# Patient Record
Sex: Male | Born: 1985 | Race: Black or African American | Hispanic: No | Marital: Single | Smoking: Former smoker
Health system: Southern US, Community
[De-identification: ages and names within clinical notes are randomized; demographics above are authoritative.]

---

## 2006-07-02 ENCOUNTER — Emergency Department (HOSPITAL_COMMUNITY): Admission: EM | Admit: 2006-07-02 | Discharge: 2006-07-02 | Payer: Self-pay | Admitting: Emergency Medicine
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
## 2015-11-14 ENCOUNTER — Emergency Department (HOSPITAL_COMMUNITY)
Admission: EM | Admit: 2015-11-14 | Discharge: 2015-11-14 | Disposition: A | Discharge status: Home / Self Care | Payer: PRIVATE HEALTH INSURANCE | Attending: Emergency Medicine | Admitting: Emergency Medicine

## 2015-11-14 DIAGNOSIS — R112 Nausea with vomiting, unspecified: Secondary | ICD-10-CM

## 2015-11-14 DIAGNOSIS — Z7982 Long term (current) use of aspirin: Secondary | ICD-10-CM | POA: Diagnosis not present

## 2015-11-14 DIAGNOSIS — R002 Palpitations: Secondary | ICD-10-CM | POA: Diagnosis present

## 2015-11-14 DIAGNOSIS — I4891 Unspecified atrial fibrillation: Secondary | ICD-10-CM

## 2015-11-14 LAB — CBC
HEMATOCRIT: 48.2 % (ref 39.0–52.0)
Hemoglobin: 17.1 g/dL — ABNORMAL HIGH (ref 13.0–17.0)
MCH: 29.3 pg (ref 26.0–34.0)
MCHC: 35.5 g/dL (ref 30.0–36.0)
MCV: 82.5 fL (ref 78.0–100.0)
PLATELETS: 188 10*3/uL (ref 150–400)
RBC: 5.84 MIL/uL — ABNORMAL HIGH (ref 4.22–5.81)
RDW: 12.5 % (ref 11.5–15.5)
WBC: 6.2 10*3/uL (ref 4.0–10.5)

## 2015-11-14 LAB — I-STAT CHEM 8, ED
BUN: 19 mg/dL (ref 6–20)
CALCIUM ION: 1.06 mmol/L — AB (ref 1.15–1.40)
CHLORIDE: 103 mmol/L (ref 101–111)
CREATININE: 0.9 mg/dL (ref 0.61–1.24)
GLUCOSE: 88 mg/dL (ref 65–99)
HCT: 51 % (ref 39.0–52.0)
Hemoglobin: 17.3 g/dL — ABNORMAL HIGH (ref 13.0–17.0)
POTASSIUM: 3.7 mmol/L (ref 3.5–5.1)
Sodium: 139 mmol/L (ref 135–145)
TCO2: 23 mmol/L (ref 0–100)

## 2015-11-14 LAB — MAGNESIUM: Magnesium: 1.9 mg/dL (ref 1.7–2.4)

## 2015-11-14 LAB — BASIC METABOLIC PANEL
Anion gap: 12 (ref 5–15)
BUN: 17 mg/dL (ref 6–20)
CALCIUM: 9.6 mg/dL (ref 8.9–10.3)
CO2: 23 mmol/L (ref 22–32)
CREATININE: 1 mg/dL (ref 0.61–1.24)
Chloride: 103 mmol/L (ref 101–111)
GFR calc Af Amer: >60 mL/min (ref >60)
GLUCOSE: 93 mg/dL (ref 65–99)
Potassium: 3.7 mmol/L (ref 3.5–5.1)
Sodium: 138 mmol/L (ref 135–145)

## 2015-11-14 MED ORDER — ONDANSETRON HCL 4 MG PO TABS: 4.0000 mg | ORAL_TABLET | Freq: Three times a day (TID) | ORAL | 0 refills | Status: AC | PRN

## 2015-11-14 MED ORDER — SODIUM CHLORIDE 0.9 % IV BOLUS (SEPSIS)
1000.0000 mL | Freq: Once | INTRAVENOUS | Status: AC
  Administered 2015-11-14: 1000 mL via INTRAVENOUS

## 2015-11-14 MED ORDER — ETOMIDATE 2 MG/ML IV SOLN
10.0000 mg | Freq: Once | INTRAVENOUS | Status: DC
  Filled 2015-11-14: qty 10

## 2015-11-14 MED ORDER — ASPIRIN EC 81 MG PO TBEC: 81.0000 mg | DELAYED_RELEASE_TABLET | Freq: Every day | ORAL | 0 refills | Status: AC

## 2015-11-14 NOTE — ED Triage Notes (Signed)
Patient was experiencing vomiting throughout the day today. Tonight after one episode of vomiting he began to experience a fluttering sensation in his chest. [facility] EMS was called from there because they noted patient to be in irregular heart rhythm. EMS found patient in Afib @ 170bpm. 20 Cardizem was given and patient's rate slowed significantly to 90s-100s. Zofran 4mg  was given and 324mg  ASA was given. Patient currently sitting up speaking with no complaints, GCS 15.

## 2015-11-14 NOTE — ED Triage Notes (Signed)
No history of Afib.

## 2015-11-14 NOTE — ED Provider Notes (Addendum)
[facility]-EMERGENCY DEPT Provider Note   CSN: [id number] Arrival date & time: 11/14/15  1914     History   Chief Complaint  Chief Complaint  Patient presents with  . Emesis  . Atrial Fibrillation    HPI Thomas Velez is a 30 y.o. male.  HPI 30 year old male with no significant past medical history presents with acute onset of palpitations. Patient states that he has had mild nausea throughout the day and then began vomiting. He has known sick contacts with similar symptoms. After his most recent episode of emesis he experienced acute onset of palpitations and shortness of breath. He felt as though his heart was beating irregularly. He went to urgent care and was noted to have a heart rate in the 170s with atrial fibrillation. He was subsequent brought to the ED. In route, EMS gave 20 mg of diltiazem IV with improvement of his heart rate to the 90s to 100s. He has remained in A. fib. Denies any personal or family history of arrhythmia. He is otherwise healthy. Denies any recent changes in medications. No heavy caffeine or alcohol use.  No past medical history on file.  There are no active problems to display for this patient.   No past surgical history on file.     Home Medications    Prior to Admission medications   Medication Sig Start Date End Date Taking? Authorizing Provider  aspirin EC 81 MG tablet Take 1 tablet (81 mg total) by mouth daily. 11/14/15   Shaune Pollackameron Kenston Longton, MD  ondansetron (ZOFRAN) 4 MG tablet Take 1 tablet (4 mg total) by mouth every 8 (eight) hours as needed for nausea or vomiting. 11/14/15   Shaune Pollackameron Ellina Sivertsen, MD    Family History No family history on file.  Social History Social History  Substance Use Topics  . Smoking status: Not on file  . Smokeless tobacco: Not on file  . Alcohol use Not on file     Allergies   Review of patient's allergies indicates not on file.   Review of Systems Review of Systems  Constitutional: Negative for chills,  fatigue and fever.  HENT: Negative for congestion and rhinorrhea.   Eyes: Negative for visual disturbance.  Respiratory: Positive for shortness of breath. Negative for cough and wheezing.   Cardiovascular: Positive for palpitations. Negative for chest pain and leg swelling.  Gastrointestinal: Negative for abdominal pain, diarrhea, nausea and vomiting.  Genitourinary: Negative for dysuria and flank pain.  Musculoskeletal: Negative for neck pain and neck stiffness.  Skin: Negative for rash and wound.  Allergic/Immunologic: Negative for immunocompromised state.  Neurological: Negative for syncope, weakness and headaches.  All other systems reviewed and are negative.    Physical Exam Updated Vital Signs BP 135/95 (BP Location: Right Arm)   Pulse 99   Temp 97.7 F (36.5 C) (Oral)   Resp 26   SpO2 100%   Physical Exam  Constitutional: He is oriented to person, place, and time. He appears well-developed and well-nourished. No distress.  HENT:  Head: Normocephalic and atraumatic.  Eyes: Conjunctivae are normal.  Neck: Neck supple.  Cardiovascular: Normal heart sounds.  An irregularly irregular rhythm present. Tachycardia present.  Exam reveals no friction rub.   No murmur heard. Pulmonary/Chest: Effort normal and breath sounds normal. No respiratory distress. He has no wheezes. He has no rales.  Abdominal: He exhibits no distension.  Musculoskeletal: He exhibits no edema.  Neurological: He is alert and oriented to person, place, and time. He exhibits normal muscle  tone.  Skin: Skin is warm. Capillary refill takes less than 2 seconds.  Psychiatric: He has a normal mood and affect.  Nursing note and vitals reviewed.    ED Treatments / Results  Labs (all labs ordered are listed, but only abnormal results are displayed) Labs Reviewed  CBC - Abnormal; Notable for the following:       Result Value   RBC 5.84 (*)    Hemoglobin 17.1 (*)    All other components within normal limits    I-STAT CHEM 8, ED - Abnormal; Notable for the following:    Calcium, Ion 1.06 (*)    Hemoglobin 17.3 (*)    All other components within normal limits  BASIC METABOLIC PANEL  MAGNESIUM    EKG  EKG Interpretation  Date/Time:  Tuesday November 14 2015 19:36:42 EDT Ventricular Rate:  81 PR Interval:    QRS Duration: 85 QT Interval:  353 QTC Calculation: 410 R Axis:   23 Text Interpretation:  Atrial fibrillation No old tracing to compare Confirmed by Jalysa Swopes MD, Monserrate Blaschke [phone]) on 11/14/2015 7:43:12 PM       Radiology No results found.  Procedures Procedures (including critical care time)  Medications Ordered in ED Medications  etomidate (AMIDATE) injection 10 mg (10 mg Intravenous Not Given 11/14/15 2155)  sodium chloride 0.9 % bolus 1,000 mL (0 mLs Intravenous Stopped 11/14/15 2150)     Initial Impression / Assessment and Plan / ED Course  I have reviewed the triage vital signs and the nursing notes.  Pertinent labs & imaging results that were available during my care of the patient were reviewed by me and considered in my medical decision making (see chart for details).  Clinical Course    30 yo male with no significant PMHx who presents with acute onset of afib with RVR in setting of vomiting. On arrival, patient tachycardic but otherwise hemodynamically stable. I suspect he has acute onset A. fib in the setting of likely viral GI illness. His abdomen is completely soft, nontender, nondistended with no focal tenderness to suggest cholecystitis, appendicitis, significant colitis, or other intra-abdominal pathology. Screening labwork is largely unremarkable with normal white blood cell count. Normal BMP. After IV fluids, patient has spontaneously converted to normal sinus rhythm. He was ambulatory throughout the ED without any recurrence of his palpitations or shortness of breath. He is tolerating by mouth and his nausea is improving. Do not feel further emergent imaging is  indicated at this time. I discussed case with Dr. Mayford Knife of cardiology. Will refer to A. fib clinic but will hold on starting beta blocker at this time as this was likely provoked in the setting of GI illness. Advised strict return precautions and placed order for A. fib follow-up.   CHA2Ds2-VASc Score for Atrial Fibrillation    Patient Score  Age <65 = 0 65-74 = 1 > 75 = 2 0  Sex Male = 0 Male = 1 0  CHF History No = 0  Yes = 1 0  HTN History No = 0  Yes = 1 0  Stroke/TIA/TE History No = 0  Yes = 1 0  Vascular Disease History No = 0  Yes = 1 0  Diabetes History No = 0  Yes = 1 0  Total:  0   0.2 % stroke rate/year from a score of 0  Final Clinical Impressions(s) / ED Diagnoses   Final diagnoses:  Atrial fibrillation with rapid ventricular response (HCC)  Non-intractable vomiting with nausea,  unspecified vomiting type    New Prescriptions Discharge Medication List as of 11/14/2015 10:15 PM    START taking these medications   Details  aspirin EC 81 MG tablet Take 1 tablet (81 mg total) by mouth daily., Starting Tue 11/14/2015, Print    ondansetron (ZOFRAN) 4 MG tablet Take 1 tablet (4 mg total) by mouth every 8 (eight) hours as needed for nausea or vomiting., Starting Tue 11/14/2015, Print         Shaune Pollack, MD 11/15/15 1610    Shaune Pollack, MD 11/16/15 1319

## 2015-11-15 ENCOUNTER — Telehealth (HOSPITAL_COMMUNITY): Payer: Self-pay | Admitting: *Deleted

## 2015-11-15 NOTE — Telephone Encounter (Signed)
LMOM for pt to [facility] to make appt.

## 2015-11-29 ENCOUNTER — Ambulatory Visit (HOSPITAL_COMMUNITY)
Admission: RE | Admit: 2015-11-29 | Discharge: 2015-11-29 | Disposition: A | Discharge status: Home / Self Care | Payer: PRIVATE HEALTH INSURANCE | Source: Ambulatory Visit | Attending: Nurse Practitioner | Admitting: Nurse Practitioner

## 2015-11-29 ENCOUNTER — Encounter (HOSPITAL_COMMUNITY): Payer: Self-pay | Admitting: Nurse Practitioner

## 2015-11-29 VITALS — BP 118/86 | HR 77 | Ht 74.0 in | Wt 241.2 lb

## 2015-11-29 DIAGNOSIS — I480 Paroxysmal atrial fibrillation: Secondary | ICD-10-CM | POA: Diagnosis present

## 2015-11-29 DIAGNOSIS — Z87891 Personal history of nicotine dependence: Secondary | ICD-10-CM | POA: Insufficient documentation

## 2015-11-29 NOTE — Progress Notes (Signed)
Primary Care Physician: No PCP Per Patient Referring Physician: [facility]   Thomas Velez is a 30 y.o. male  In the afib clinic for evaluation. He is a f/u for the ER. He had a stomach virus and had vomiting throughout  that day and became aware of rapid heartbeat. He first went to urgent care and was found to be in afib at 170 bpm. He was sent to the ER in EMS and was given 20 mg diltiazem IV en route with slowing of heart rate to 90's but continued in afib. However, he did return to SR prior to leaving ER and afib was felt due to acute illness.  In the clinic, pt has continued is SR. He has some snoring, but no significant apnea or daytime somnolence. No more than 2 beers a week. No illicit drugs. No excessive caffeine. He walks a lot at work but no regular exercise. He denies prior irregular heart beat. Health is good and has a CHA2DS2VASc of 0.  Today, he denies symptoms of palpitations, chest pain, shortness of breath, orthopnea, PND, lower extremity edema, dizziness, presyncope, syncope, or neurologic sequela. The patient is tolerating medications without difficulties and is otherwise without complaint today.   No past medical history on file. No past surgical history on file.  Current Outpatient Prescriptions  Medication Sig Dispense Refill  . aspirin EC 81 MG tablet Take 1 tablet (81 mg total) by mouth daily. (Patient not taking: Reported on 11/29/2015) 30 tablet 0  . ondansetron (ZOFRAN) 4 MG tablet Take 1 tablet (4 mg total) by mouth every 8 (eight) hours as needed for nausea or vomiting. (Patient not taking: Reported on 11/29/2015) 12 tablet 0   No current facility-administered medications for this encounter.     Allergies no known allergies  Social History   Social History  . Marital status: Single    Spouse name: N/A  . Number of children: N/A  . Years of education: N/A   Occupational History  . Not on file.   Social History Main Topics  . Smoking status:  Former Smoker    Packs/day: 0.04    Types: Cigarettes    Quit date: 05/29/2015  . Smokeless tobacco: Never Used  . Alcohol use 0.6 oz/week    1 Cans of beer per week  . Drug use: Unknown  . Sexual activity: Not on file   Other Topics Concern  . Not on file   Social History Narrative  . No narrative on file    No family history on file.  ROS- All systems are reviewed and negative except as per the HPI above  Physical Exam: Vitals:   11/29/15 1149  BP: 118/86  Pulse: 77  Weight: 241 lb 3.2 oz (109.4 kg)  Height: 6\' 2"  (1.88 m)    GEN- The patient is well appearing, alert and oriented x 3 today.   Head- normocephalic, atraumatic Eyes-  Sclera clear, conjunctiva pink Ears- hearing intact Oropharynx- clear Neck- supple, no JVP Lymph- no cervical lymphadenopathy Lungs- Clear to ausculation bilaterally, normal work of breathing Heart- Regular rate and rhythm, no murmurs, rubs or gallops, PMI not laterally displaced GI- soft, NT, ND, + BS Extremities- no clubbing, cyanosis, or edema MS- no significant deformity or atrophy Skin- no rash or lesion Psych- euthymic mood, full affect Neuro- strength and sensation are intact  EKG-NSR at 77 bpm, pr int 136 ms, qrs int 94 ms, qtc 430 ms Epic records reviewed  Assessment and Plan: 1. Paroxsymal afib Possibly secondary to acute illness  Has been back in SR since ER visit Echo scheduled No outstanding lifestyle issues  Will be called re results of echo and seen back in afib clinic as needed  Lupita LeashDonna C. Matthew Folksarroll, ANP-C [facility] [place] [address] [place], [place] [id number] [phone]

## 2015-12-13 ENCOUNTER — Ambulatory Visit (HOSPITAL_COMMUNITY): Admission: RE | Admit: 2015-12-13 | Payer: PRIVATE HEALTH INSURANCE | Source: Ambulatory Visit

## 2015-12-13 ENCOUNTER — Encounter (HOSPITAL_COMMUNITY): Payer: Self-pay | Admitting: *Deleted
